# Patient Record
Sex: Male | Born: 1965 | Race: White | Hispanic: No | Marital: Married | Smoking: Never smoker
Health system: Southern US, Community
[De-identification: ages and names within clinical notes are randomized; demographics above are authoritative.]

## PROBLEM LIST (undated history)

## (undated) DIAGNOSIS — E119 Type 2 diabetes mellitus without complications: Secondary | ICD-10-CM

---

## 2002-02-15 ENCOUNTER — Encounter: Payer: Self-pay | Admitting: Internal Medicine

## 2002-02-15 ENCOUNTER — Ambulatory Visit (HOSPITAL_COMMUNITY): Admission: RE | Admit: 2002-02-15 | Discharge: 2002-02-15 | Payer: Self-pay | Admitting: Internal Medicine

## 2002-03-08 ENCOUNTER — Encounter: Payer: Self-pay | Admitting: Internal Medicine

## 2002-03-08 ENCOUNTER — Ambulatory Visit (HOSPITAL_COMMUNITY): Admission: RE | Admit: 2002-03-08 | Discharge: 2002-03-08 | Payer: Self-pay | Admitting: Internal Medicine

## 2014-09-30 ENCOUNTER — Emergency Department (INDEPENDENT_AMBULATORY_CARE_PROVIDER_SITE_OTHER)
Admission: EM | Admit: 2014-09-30 | Discharge: 2014-09-30 | Disposition: A | Discharge status: Home / Self Care | Payer: Commercial Managed Care - PPO | Source: Home / Self Care | Attending: Emergency Medicine | Admitting: Emergency Medicine

## 2014-09-30 ENCOUNTER — Encounter: Payer: Self-pay | Admitting: Emergency Medicine

## 2014-09-30 ENCOUNTER — Emergency Department (INDEPENDENT_AMBULATORY_CARE_PROVIDER_SITE_OTHER): Payer: Commercial Managed Care - PPO

## 2014-09-30 DIAGNOSIS — S92912A Unspecified fracture of left toe(s), initial encounter for closed fracture: Secondary | ICD-10-CM

## 2014-09-30 DIAGNOSIS — S92512A Displaced fracture of proximal phalanx of left lesser toe(s), initial encounter for closed fracture: Secondary | ICD-10-CM | POA: Diagnosis not present

## 2014-09-30 DIAGNOSIS — X58XXXA Exposure to other specified factors, initial encounter: Secondary | ICD-10-CM

## 2014-09-30 HISTORY — DX: Type 2 diabetes mellitus without complications: E11.9

## 2014-09-30 NOTE — ED Provider Notes (Signed)
CSN: [id number]     Arrival date & time 09/30/14  0912 History   First MD Initiated Contact with Patient 09/30/14 0930     Chief Complaint  Patient presents with  . Toe Injury   (Consider location/radiation/quality/duration/timing/severity/associated sxs/prior Treatment) Patient is a 49 y.o. male presenting with toe pain. The history is provided by the patient. No language interpreter was used.  Toe Pain This is a new problem. The problem occurs constantly. The problem has not changed since onset.Nothing aggravates the symptoms. Nothing relieves the symptoms. He has tried nothing for the symptoms. The treatment provided no relief.  Pt hit toe,  Pt reports toe is angled to the side.  Past Medical History  Diagnosis Date  . Diabetes    History reviewed. No pertinent past surgical history. No family history on file. Social History  Substance Use Topics  . Smoking status: Never Smoker   . Smokeless tobacco: None  . Alcohol Use: Yes     Comment: socially    Review of Systems  All other systems reviewed and are negative.   Allergies  Review of patient's allergies indicates no known allergies.  Home Medications   Prior to Admission medications   Medication Sig Start Date End Date Taking? Authorizing Provider  insulin aspart (NOVOLOG) 100 UNIT/ML injection Inject into the skin 3 (three) times daily before meals.   Yes Historical Provider, MD  insulin glargine (LANTUS) 100 UNIT/ML injection Inject into the skin at bedtime.   Yes Historical Provider, MD   Meds Ordered and Administered this Visit  Medications - No data to display  BP 125/75 mmHg  Pulse 61  Temp(Src) 98.1 F (36.7 C) (Oral)  Ht 6\' 1"  (1.854 m)  Wt 183 lb (83.008 kg)  BMI 24.15 kg/m2  SpO2 99% No data found.   Physical Exam  Constitutional: He appears well-developed and well-nourished.  HENT:  Head: Normocephalic.  Musculoskeletal: He exhibits edema and tenderness.  Bruised, angled outward nv and ns  intact  Neurological: He is alert.  Skin: Skin is warm.  Psychiatric: He has a normal mood and affect.    ED Course  Reduction of fracture Date/Time: 09/30/2014 10:32 AM Performed by: Elson Areas Authorized by: Georgina Pillion, DAVID Consent: Verbal consent obtained. Risks and benefits: risks, benefits and alternatives were discussed Patient identity confirmed: verbally with patient Time out: Immediately prior to procedure a "time out" was called to verify the correct patient, procedure, equipment, support staff and site/side marked as required. Preparation: Patient was prepped and draped in the usual sterile fashion. Local anesthesia used: yes Local anesthetic: bupivacaine 0.25% without epinephrine Patient tolerance: Patient tolerated the procedure well with no immediate complications Comments: Reduced using 3cc syringe between toes and gentle traction,   Toe straight after procedure.   (including critical care time)  Labs Review Labs Reviewed - No data to display  Imaging Review Dg Toe 5th Left  09/30/2014   CLINICAL DATA:  Pt states he stubbed his left 5th toe this morning and is having pain and cannot move it.  EXAM: DG TOE 5TH LEFT  COMPARISON:  None.  FINDINGS: There is a fracture of the fifth proximal phalanx. There is significant apex medial angulation.  IMPRESSION: Angulated fracture proximal fifth phalanx   Electronically Signed   By: Esperanza Heir M.D.   On: 09/30/2014 10:02     Visual Acuity Review  Right Eye Distance:   Left Eye Distance:   Bilateral Distance:    Right Eye Near:  Left Eye Near:    Bilateral Near:         MDM  Pt declined post xray, Pt happy with results He is advised to follow up with Dr. Denyse Amass if any problems.   1. Fracture, toe, left, closed, initial encounter    Buddy tape Ibuprofen See Dr. Denyse Amass for recheck if any problems.    Lonia Skinner Castella, PA-C 09/30/14 1034

## 2014-09-30 NOTE — Discharge Instructions (Signed)
Toe Fracture Your caregiver has diagnosed you as having a fractured toe. A toe fracture is a break in the bone of a toe. "Buddy taping" is a way of splinting your broken toe, by taping the broken toe to the toe next to it. This "buddy taping" will keep the injured toe from moving beyond normal range of motion. Buddy taping also helps the toe heal in a more normal alignment. It may take 6 to 8 weeks for the toe injury to heal. HOME CARE INSTRUCTIONS   Leave your toes taped together for as long as directed by your caregiver or until you see a doctor for a follow-up examination. You can change the tape after bathing. Always use a small piece of gauze or cotton between the toes when taping them together. This will help the skin stay dry and prevent infection.  Apply ice to the injury for 15-20 minutes each hour while awake for the first 2 days. Put the ice in a plastic bag and place a towel between the bag of ice and your skin.  After the first 2 days, apply heat to the injured area. Use heat for the next 2 to 3 days. Place a heating pad on the foot or soak the foot in warm water as directed by your caregiver.  Keep your foot elevated as much as possible to lessen swelling.  Wear sturdy, supportive shoes. The shoes should not pinch the toes or fit tightly against the toes.  Your caregiver may prescribe a rigid shoe if your foot is very swollen.  Your may be given crutches if the pain is too great and it hurts too much to walk.  Only take over-the-counter or prescription medicines for pain, discomfort, or fever as directed by your caregiver.  If your caregiver has given you a follow-up appointment, it is very important to keep that appointment. Not keeping the appointment could result in a chronic or permanent injury, pain, and disability. If there is any problem keeping the appointment, you must call back to this facility for assistance. SEEK MEDICAL CARE IF:   You have increased pain or swelling,  not relieved with medications.  The pain does not get better after 1 week.  Your injured toe is cold when the others are warm. SEEK IMMEDIATE MEDICAL CARE IF:   The toe becomes cold, numb, or white.  The toe becomes hot (inflamed) and red. Document Released: 12/28/1999 Document Revised: 03/24/2011 Document Reviewed: 08/16/2007 [facility] Patient Information 2015 [place], [place]. This information is not intended to replace advice given to you by your health care provider. Make sure you discuss any questions you have with your health care provider. Buddy Taping You have a minor finger or toe injury. It can be managed by buddy taping. Buddy taping means the injured finger or toe is taped to a healthy uninjured adjacent finger or toe. Most minor fractures and dislocations of the smaller fingers and toes will heal in 3 to 4 weeks. Buddy taping immobilizes and protects the area of injury. Buddy taping is not recommended for initial treatment of fractures of the thumb, longer fingers, or the great toe. Buddy taping should not be used for unstable or deformed fractures, but as fracture healing progresses it may be used for protection during rehabilitation. Fractured fingers and toes should be protected by buddy taping as long as the injury is still painful or swollen.  When an injury is buddy taped, place a small piece of gauze or cotton between the digits  that are taped. This helps prevent the skin from breaking down from increased moisture. Buddy taping allows you to get your injury wet when you bathe. Change the gauze and tape more often if it gets wet, and dry the space between the finger or toes. Use a sturdy, hard-soled shoe for better support if you have a fractured toe. In 2 to 3 weeks you can start motion exercises. This will keep the fingers or toes from becoming stiff.  SEEK IMMEDIATE MEDICAL CARE IF:   The injured area becomes cold, numb, or pale.  You have pain not controlled with  medications.  You notice increasing deformity of the toe or finger. Document Released: 02/07/2004 Document Revised: 03/24/2011 Document Reviewed: 06/07/2008 [facility] Patient Information 2015 [place], [place]. This information is not intended to replace advice given to you by your health care provider. Make sure you discuss any questions you have with your health care provider.

## 2014-09-30 NOTE — ED Notes (Signed)
Pt states that he possibly broke his left pinky toe this am.  Toe is bruised and deformed.

## 2016-04-09 IMAGING — CR DG TOE 5TH 2+V*L*
3 series · 3 of 3 positions shown · non-contrast
Comparison: None.

CLINICAL DATA: Pt states he stubbed his left 5th toe this morning
and is having pain and cannot move it.

EXAM:
DG TOE 5TH LEFT

[toe ap]
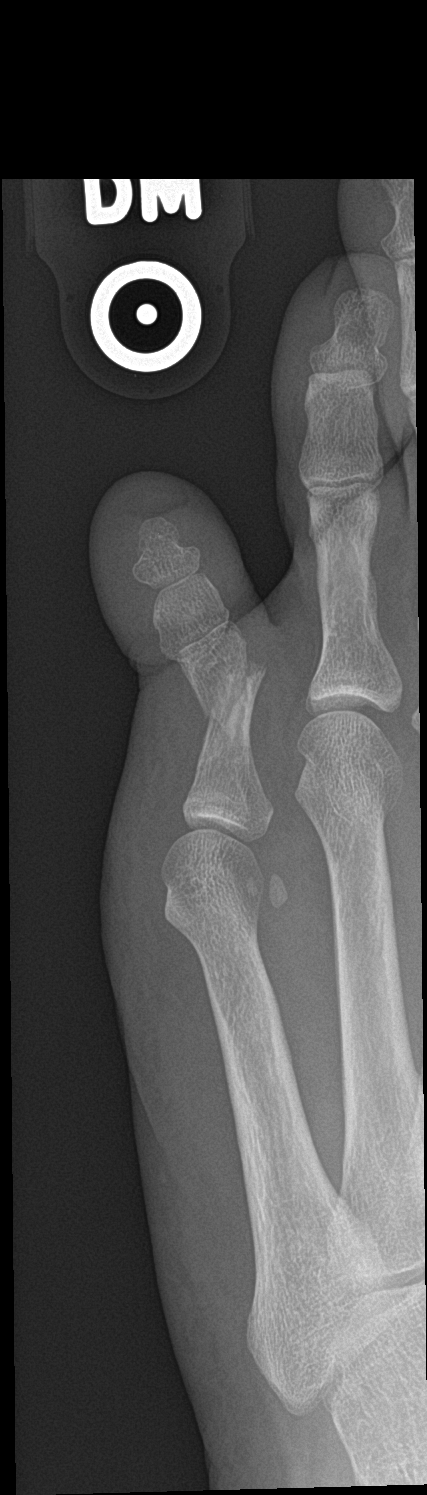

[toe obl]
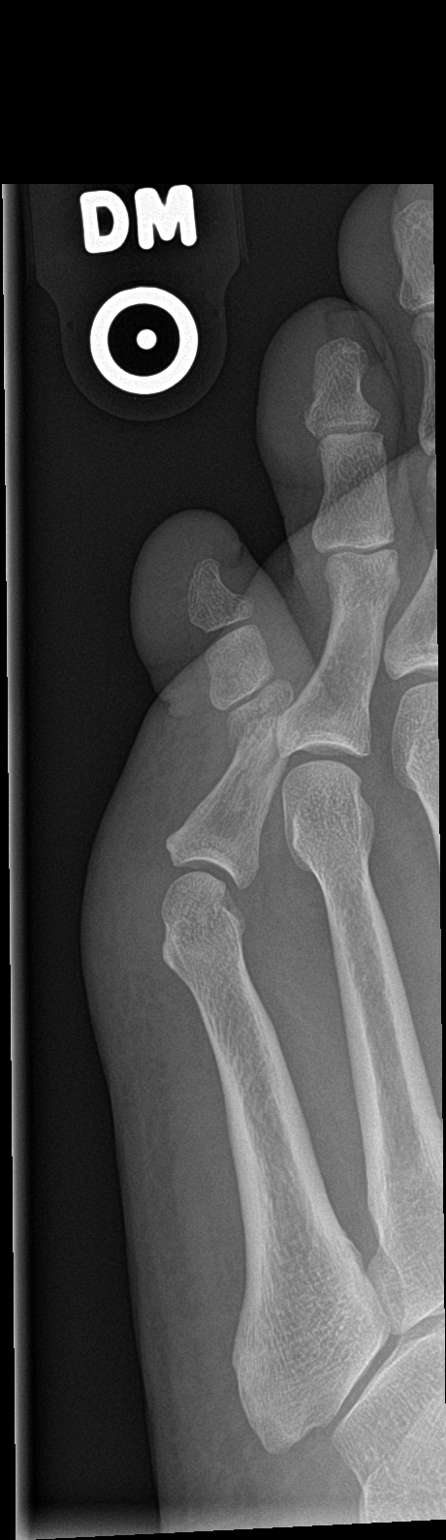

[toe lat]
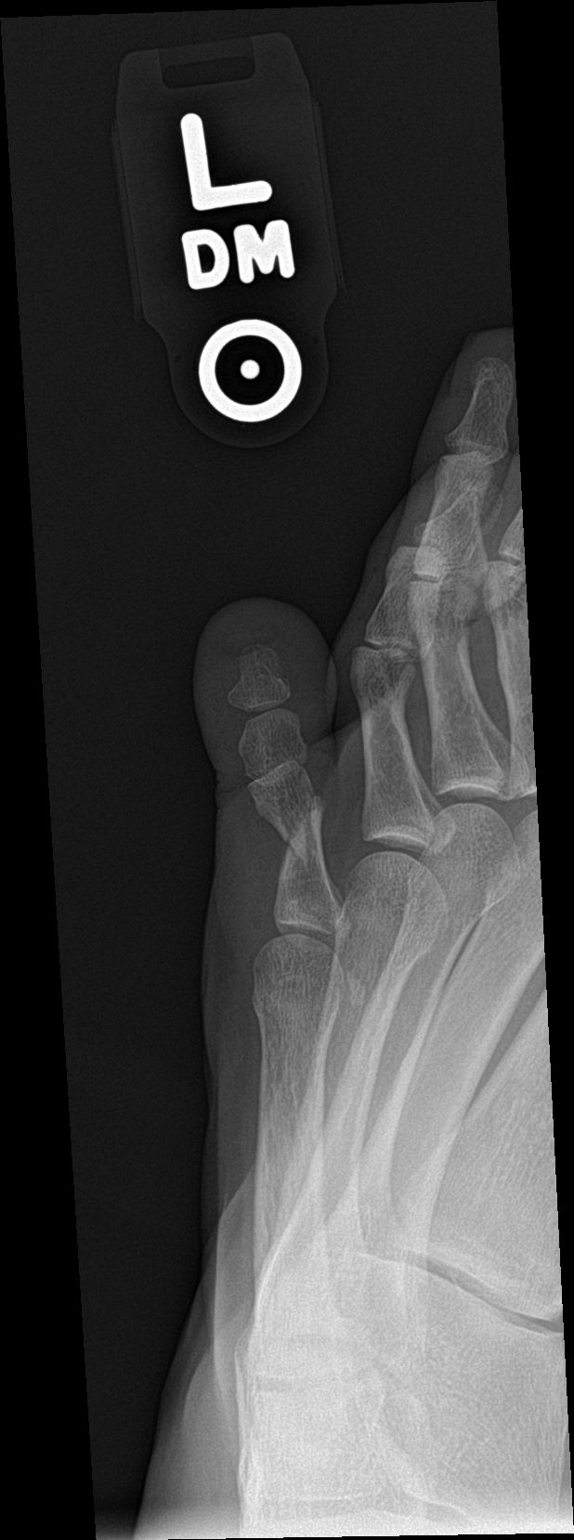

[3 of 3 positions shown; findings below may reference images not displayed]

FINDINGS: There is a fracture of the fifth proximal phalanx. There is
significant apex medial angulation.
IMPRESSION: Angulated fracture proximal fifth phalanx
